# Patient Record
Sex: Female | Born: 1955 | Race: White | Hispanic: No | Marital: Married | Smoking: Never smoker
Health system: Southern US, Community
[De-identification: ages and names within clinical notes are randomized; demographics above are authoritative.]

## PROBLEM LIST (undated history)

## (undated) DIAGNOSIS — E785 Hyperlipidemia, unspecified: Secondary | ICD-10-CM

## (undated) DIAGNOSIS — I10 Essential (primary) hypertension: Secondary | ICD-10-CM

## (undated) DIAGNOSIS — H8109 Meniere's disease, unspecified ear: Secondary | ICD-10-CM

## (undated) DIAGNOSIS — R7301 Impaired fasting glucose: Secondary | ICD-10-CM

## (undated) DIAGNOSIS — R42 Dizziness and giddiness: Secondary | ICD-10-CM

## (undated) HISTORY — DX: Impaired fasting glucose: R73.01

## (undated) HISTORY — PX: BARTHOLIN GLAND CYST EXCISION: SHX565

## (undated) HISTORY — DX: Essential (primary) hypertension: I10

## (undated) HISTORY — PX: ABDOMINAL HYSTERECTOMY: SHX81

## (undated) HISTORY — DX: Hyperlipidemia, unspecified: E78.5

## (undated) HISTORY — DX: Dizziness and giddiness: R42

## (undated) HISTORY — PX: OTHER SURGICAL HISTORY: SHX169

## (undated) HISTORY — DX: Meniere's disease, unspecified ear: H81.09

## (undated) HISTORY — PX: DILATION AND CURETTAGE OF UTERUS: SHX78

---

## 2002-06-22 ENCOUNTER — Encounter: Payer: Self-pay | Admitting: Family Medicine

## 2002-06-22 ENCOUNTER — Ambulatory Visit (HOSPITAL_COMMUNITY): Admission: RE | Admit: 2002-06-22 | Discharge: 2002-06-22 | Payer: Self-pay | Admitting: Family Medicine

## 2004-02-04 ENCOUNTER — Ambulatory Visit (HOSPITAL_COMMUNITY): Admission: RE | Admit: 2004-02-04 | Discharge: 2004-02-04 | Payer: Self-pay | Admitting: Family Medicine

## 2006-07-06 ENCOUNTER — Other Ambulatory Visit: Admission: RE | Admit: 2006-07-06 | Discharge: 2006-07-06 | Payer: Self-pay | Admitting: Gynecology

## 2006-09-16 ENCOUNTER — Emergency Department (HOSPITAL_COMMUNITY): Admission: EM | Admit: 2006-09-16 | Discharge: 2006-09-16 | Payer: Self-pay | Admitting: Emergency Medicine

## 2006-12-12 ENCOUNTER — Ambulatory Visit (HOSPITAL_COMMUNITY): Admission: RE | Admit: 2006-12-12 | Discharge: 2006-12-13 | Payer: Self-pay | Admitting: Gynecology

## 2011-06-09 ENCOUNTER — Other Ambulatory Visit (HOSPITAL_COMMUNITY): Payer: Self-pay | Admitting: Internal Medicine

## 2011-06-09 DIAGNOSIS — Z139 Encounter for screening, unspecified: Secondary | ICD-10-CM

## 2011-06-14 ENCOUNTER — Ambulatory Visit (HOSPITAL_COMMUNITY)
Admission: RE | Admit: 2011-06-14 | Discharge: 2011-06-14 | Disposition: A | Discharge status: Home / Self Care | Payer: BC Managed Care – PPO | Source: Ambulatory Visit | Attending: Internal Medicine | Admitting: Internal Medicine

## 2011-06-14 DIAGNOSIS — Z1231 Encounter for screening mammogram for malignant neoplasm of breast: Secondary | ICD-10-CM | POA: Insufficient documentation

## 2011-06-14 DIAGNOSIS — Z139 Encounter for screening, unspecified: Secondary | ICD-10-CM

## 2011-06-30 ENCOUNTER — Encounter (INDEPENDENT_AMBULATORY_CARE_PROVIDER_SITE_OTHER): Payer: Self-pay | Admitting: *Deleted

## 2011-09-23 ENCOUNTER — Ambulatory Visit (INDEPENDENT_AMBULATORY_CARE_PROVIDER_SITE_OTHER): Payer: BC Managed Care – PPO | Admitting: Otolaryngology

## 2011-09-23 DIAGNOSIS — H9319 Tinnitus, unspecified ear: Secondary | ICD-10-CM

## 2011-09-23 DIAGNOSIS — H903 Sensorineural hearing loss, bilateral: Secondary | ICD-10-CM

## 2012-07-14 ENCOUNTER — Ambulatory Visit: Payer: BC Managed Care – PPO | Attending: Otolaryngology | Admitting: Audiology

## 2012-07-14 DIAGNOSIS — Z0110 Encounter for examination of ears and hearing without abnormal findings: Secondary | ICD-10-CM | POA: Insufficient documentation

## 2012-07-14 DIAGNOSIS — Z0389 Encounter for observation for other suspected diseases and conditions ruled out: Secondary | ICD-10-CM | POA: Insufficient documentation

## 2012-09-21 ENCOUNTER — Ambulatory Visit (INDEPENDENT_AMBULATORY_CARE_PROVIDER_SITE_OTHER): Payer: BC Managed Care – PPO | Admitting: Otolaryngology

## 2013-02-01 ENCOUNTER — Other Ambulatory Visit (HOSPITAL_COMMUNITY): Payer: Self-pay | Admitting: Internal Medicine

## 2013-02-01 DIAGNOSIS — M810 Age-related osteoporosis without current pathological fracture: Secondary | ICD-10-CM

## 2013-02-02 ENCOUNTER — Other Ambulatory Visit (HOSPITAL_COMMUNITY): Payer: BC Managed Care – PPO

## 2014-04-26 ENCOUNTER — Encounter (INDEPENDENT_AMBULATORY_CARE_PROVIDER_SITE_OTHER): Payer: Self-pay | Admitting: *Deleted

## 2016-04-06 ENCOUNTER — Other Ambulatory Visit (HOSPITAL_COMMUNITY): Payer: Self-pay | Admitting: Internal Medicine

## 2016-04-06 DIAGNOSIS — Z1231 Encounter for screening mammogram for malignant neoplasm of breast: Secondary | ICD-10-CM

## 2016-04-08 ENCOUNTER — Ambulatory Visit (INDEPENDENT_AMBULATORY_CARE_PROVIDER_SITE_OTHER): Payer: BC Managed Care – PPO | Admitting: Otolaryngology

## 2016-04-08 DIAGNOSIS — H8109 Meniere's disease, unspecified ear: Secondary | ICD-10-CM

## 2016-04-08 DIAGNOSIS — H903 Sensorineural hearing loss, bilateral: Secondary | ICD-10-CM | POA: Diagnosis not present

## 2016-04-15 ENCOUNTER — Ambulatory Visit (HOSPITAL_COMMUNITY): Payer: BC Managed Care – PPO

## 2016-04-26 ENCOUNTER — Ambulatory Visit (HOSPITAL_COMMUNITY): Payer: BC Managed Care – PPO

## 2016-05-03 ENCOUNTER — Ambulatory Visit (HOSPITAL_COMMUNITY)
Admission: RE | Admit: 2016-05-03 | Discharge: 2016-05-03 | Disposition: A | Discharge status: Home / Self Care | Payer: BC Managed Care – PPO | Source: Ambulatory Visit | Attending: Internal Medicine | Admitting: Internal Medicine

## 2016-05-03 DIAGNOSIS — Z1231 Encounter for screening mammogram for malignant neoplasm of breast: Secondary | ICD-10-CM | POA: Insufficient documentation

## 2016-06-07 ENCOUNTER — Ambulatory Visit (INDEPENDENT_AMBULATORY_CARE_PROVIDER_SITE_OTHER): Payer: BC Managed Care – PPO | Admitting: Otolaryngology

## 2016-06-07 DIAGNOSIS — H8102 Meniere's disease, left ear: Secondary | ICD-10-CM

## 2016-06-07 DIAGNOSIS — H9313 Tinnitus, bilateral: Secondary | ICD-10-CM

## 2016-06-07 DIAGNOSIS — H903 Sensorineural hearing loss, bilateral: Secondary | ICD-10-CM | POA: Diagnosis not present

## 2017-04-13 ENCOUNTER — Other Ambulatory Visit (HOSPITAL_COMMUNITY): Payer: Self-pay | Admitting: Internal Medicine

## 2017-04-13 DIAGNOSIS — H539 Unspecified visual disturbance: Secondary | ICD-10-CM

## 2017-04-13 DIAGNOSIS — R51 Headache: Secondary | ICD-10-CM

## 2017-04-13 DIAGNOSIS — R519 Headache, unspecified: Secondary | ICD-10-CM

## 2017-04-13 DIAGNOSIS — R42 Dizziness and giddiness: Secondary | ICD-10-CM

## 2017-04-22 ENCOUNTER — Ambulatory Visit (HOSPITAL_COMMUNITY)
Admission: RE | Admit: 2017-04-22 | Discharge: 2017-04-22 | Disposition: A | Discharge status: Home / Self Care | Payer: BC Managed Care – PPO | Source: Ambulatory Visit | Attending: Internal Medicine | Admitting: Internal Medicine

## 2017-04-22 DIAGNOSIS — H539 Unspecified visual disturbance: Secondary | ICD-10-CM | POA: Insufficient documentation

## 2017-04-22 DIAGNOSIS — R519 Headache, unspecified: Secondary | ICD-10-CM

## 2017-04-22 DIAGNOSIS — R42 Dizziness and giddiness: Secondary | ICD-10-CM | POA: Diagnosis not present

## 2017-04-22 DIAGNOSIS — R51 Headache: Secondary | ICD-10-CM | POA: Diagnosis present

## 2017-05-19 ENCOUNTER — Ambulatory Visit (INDEPENDENT_AMBULATORY_CARE_PROVIDER_SITE_OTHER): Payer: BC Managed Care – PPO | Admitting: Otolaryngology

## 2017-05-19 DIAGNOSIS — R42 Dizziness and giddiness: Secondary | ICD-10-CM

## 2017-05-19 DIAGNOSIS — H8102 Meniere's disease, left ear: Secondary | ICD-10-CM

## 2017-05-19 DIAGNOSIS — H903 Sensorineural hearing loss, bilateral: Secondary | ICD-10-CM | POA: Diagnosis not present

## 2017-06-20 ENCOUNTER — Ambulatory Visit (INDEPENDENT_AMBULATORY_CARE_PROVIDER_SITE_OTHER): Payer: BC Managed Care – PPO | Admitting: Otolaryngology

## 2017-06-20 DIAGNOSIS — H8102 Meniere's disease, left ear: Secondary | ICD-10-CM

## 2017-06-20 DIAGNOSIS — H903 Sensorineural hearing loss, bilateral: Secondary | ICD-10-CM

## 2017-12-26 ENCOUNTER — Ambulatory Visit (INDEPENDENT_AMBULATORY_CARE_PROVIDER_SITE_OTHER): Payer: BC Managed Care – PPO | Admitting: Otolaryngology

## 2017-12-26 DIAGNOSIS — R040 Epistaxis: Secondary | ICD-10-CM

## 2017-12-26 DIAGNOSIS — H8102 Meniere's disease, left ear: Secondary | ICD-10-CM | POA: Diagnosis not present

## 2017-12-26 DIAGNOSIS — H903 Sensorineural hearing loss, bilateral: Secondary | ICD-10-CM | POA: Diagnosis not present

## 2018-05-16 ENCOUNTER — Other Ambulatory Visit: Payer: Self-pay | Admitting: Internal Medicine

## 2018-05-16 DIAGNOSIS — Z780 Asymptomatic menopausal state: Secondary | ICD-10-CM

## 2018-05-31 ENCOUNTER — Other Ambulatory Visit: Payer: Self-pay | Admitting: Internal Medicine

## 2018-05-31 DIAGNOSIS — Z1231 Encounter for screening mammogram for malignant neoplasm of breast: Secondary | ICD-10-CM

## 2018-06-07 ENCOUNTER — Ambulatory Visit (HOSPITAL_COMMUNITY)
Admission: RE | Admit: 2018-06-07 | Discharge: 2018-06-07 | Disposition: A | Discharge status: Home / Self Care | Payer: BC Managed Care – PPO | Source: Ambulatory Visit | Attending: Internal Medicine | Admitting: Internal Medicine

## 2018-06-07 DIAGNOSIS — Z780 Asymptomatic menopausal state: Secondary | ICD-10-CM | POA: Diagnosis present

## 2018-06-07 DIAGNOSIS — Z1231 Encounter for screening mammogram for malignant neoplasm of breast: Secondary | ICD-10-CM | POA: Insufficient documentation

## 2018-06-26 ENCOUNTER — Ambulatory Visit (INDEPENDENT_AMBULATORY_CARE_PROVIDER_SITE_OTHER): Payer: BC Managed Care – PPO | Admitting: Otolaryngology

## 2018-06-26 ENCOUNTER — Ambulatory Visit: Payer: BC Managed Care – PPO

## 2018-06-26 DIAGNOSIS — H8102 Meniere's disease, left ear: Secondary | ICD-10-CM

## 2018-06-26 DIAGNOSIS — H903 Sensorineural hearing loss, bilateral: Secondary | ICD-10-CM | POA: Diagnosis not present

## 2018-07-26 ENCOUNTER — Encounter: Payer: Self-pay | Admitting: *Deleted

## 2018-07-27 ENCOUNTER — Encounter: Payer: Self-pay | Admitting: Neurology

## 2018-07-27 ENCOUNTER — Encounter

## 2018-07-27 ENCOUNTER — Ambulatory Visit: Payer: BC Managed Care – PPO | Admitting: Neurology

## 2018-07-27 VITALS — BP 137/94 | HR 95 | Ht 63.0 in | Wt 187.0 lb

## 2018-07-27 DIAGNOSIS — H8109 Meniere's disease, unspecified ear: Secondary | ICD-10-CM | POA: Diagnosis not present

## 2018-07-27 DIAGNOSIS — H9190 Unspecified hearing loss, unspecified ear: Secondary | ICD-10-CM | POA: Insufficient documentation

## 2018-07-27 NOTE — Progress Notes (Addendum)
PATIENT: Yolanda Novak DOB: February 13, 1956  Chief Complaint  Patient presents with  . Dizziness/Meniere Disease    Orthostatic Vitals: Lying: 137/94, 95, Sitting: 139/86, 95, Standing: 145/82, 91, Standing x 3 minutes: 144/83, 89.  Reports intermittent dizziness often worsened by sudden, positional changes.  She has also had dizzy spells that will just last throughout the day.  Reports ringing in her bilateral ears.  She is under the care of ENT. She has had a normal brain MRI.  Marland Kitchen PCP    [name], MD     HISTORICAL  Yolanda Novak is a 62 years old female, seen in request by primary care physician Dr. [name], [name] for evaluation of dizziness, initial evaluation was on July 27, 2018.  I have reviewed and summarized the referring note from the referring physician, she has past medical history of hypertension, is on hydrochlorothiazide treatment.  She noticed gradual onset hearing loss since 2009, she began to have intermittent episode of intense vertigo, spinning sensation, and bilateral ear fullness sensation, decreased hearing, she had few spells each year, with no associated headaches," and never had a history of headaches".  Over the past few years, her spinning spells has much improved, but she continues to have steady decline of her hearing loss, right worse than left, over the years, she was seen by ENT physician, was diagnosed with Mnire's disease, now wearing bilateral hearing aid.  But her hearing loss continued to progressively getting worse, she has to upgrade her hearing aid periodically.  She works as an Environmental health practitioner, noticed difficulty understanding people's conversation, has to stare at people's mouth, she denies gait abnormality   Personally reviewed MRI of the brain without contrast in June 2018 that was normal.  REVIEW OF SYSTEMS: Full 14 system review of systems performed and notable only for hearing loss, ringing in ears, spinning sensation,  dizziness All other review of systems were negative.  ALLERGIES: Allergies  Allergen Reactions  . Sulfamethoxazole Other (See Comments)    Unsure of her reaction as a child.    HOME MEDICATIONS: Current Outpatient Medications  Medication Sig Dispense Refill  . CALCIUM PO Take 1 tablet by mouth daily.    . Multiple Vitamin (MULTIVITAMIN) capsule Take 1 capsule by mouth daily.    . Olmesartan-amLODIPine-HCTZ 20-5-12.5 MG TABS Take 1 tablet by mouth daily.  1   No current facility-administered medications for this visit.     PAST MEDICAL HISTORY: Past Medical History:  Diagnosis Date  . Dizziness   . Hyperlipemia   . Hypertension   . Impaired fasting glucose   . Meniere disease     PAST SURGICAL HISTORY: Past Surgical History:  Procedure Laterality Date  . ABDOMINAL HYSTERECTOMY  2000's  . BARTHOLIN GLAND CYST EXCISION  1970'S  . BROKEN RIGHT ARM REPAIR  1960's  . DILATION AND CURETTAGE OF UTERUS  1980's    FAMILY HISTORY: Family History  Problem Relation Age of Onset  . Other Mother        failure to thrive  . Stomach cancer Father   . Throat cancer Brother   . Kidney cancer Brother   . Diabetes Brother   . Heart disease Paternal Grandmother     SOCIAL HISTORY: Social History   Socioeconomic History  . Marital status: Married    Spouse name: Not on file  . Number of children: 2  . Years of education: college  . Highest education level: Associate degree: occupational, Scientist, product/process development, or vocational  program  Occupational History  . Occupation: Environmental health practitionerAdministrative Assistant  Social Needs  . Financial resource strain: Not on file  . Food insecurity:    Worry: Not on file    Inability: Not on file  . Transportation needs:    Medical: Not on file    Non-medical: Not on file  Tobacco Use  . Smoking status: Never Smoker  . Smokeless tobacco: Never Used  Substance and Sexual Activity  . Alcohol use: Yes    Frequency: Never    Comment: rare mixed drink  . Drug  use: Never  . Sexual activity: Not on file  Lifestyle  . Physical activity:    Days per week: Not on file    Minutes per session: Not on file  . Stress: Not on file  Relationships  . Social connections:    Talks on phone: Not on file    Gets together: Not on file    Attends religious service: Not on file    Active member of club or organization: Not on file    Attends meetings of clubs or organizations: Not on file    Relationship status: Not on file  . Intimate partner violence:    Fear of current or ex partner: Not on file    Emotionally abused: Not on file    Physically abused: Not on file    Forced sexual activity: Not on file  Other Topics Concern  . Not on file  Social History Narrative   Caffeine use: 1 cup coffee, 2 glasses of tea.   Right-handed.   Lives at home with her husband, Craige CottaKirby.     PHYSICAL EXAM   Vitals:   07/27/18 0745  BP: (!) 137/94  Pulse: 95  Weight: 187 lb (84.8 kg)  Height: 5\' 3"  (1.6 m)    Not recorded      Body mass index is 33.13 kg/m.  PHYSICAL EXAMNIATION:  Gen: NAD, conversant, well nourised, obese, well groomed                     Cardiovascular: Regular rate rhythm, no peripheral edema, warm, nontender. Eyes: Conjunctivae clear without exudates or hemorrhage Neck: Supple, no carotid bruits. Pulmonary: Clear to auscultation bilaterally   NEUROLOGICAL EXAM:  MENTAL STATUS: Speech:    Speech is normal; fluent and spontaneous with normal comprehension.  Cognition:     Orientation to time, place and person     Normal recent and remote memory     Normal Attention span and concentration     Normal Language, naming, repeating,spontaneous speech     Fund of knowledge   CRANIAL NERVES: CN II: Visual fields are full to confrontation. Fundoscopic exam is normal with sharp discs and no vascular changes. Pupils are round equal and briskly reactive to light. CN III, IV, VI: extraocular movement are normal. No ptosis. CN V: Facial  sensation is intact to pinprick in all 3 divisions bilaterally. Corneal responses are intact.  CN VII: Face is symmetric with normal eye closure and smile. CN VIII: Decreased hearing bilaterally CN IX, X: Palate elevates symmetrically. Phonation is normal. CN XI: Head turning and shoulder shrug are intact CN XII: Tongue is midline with normal movements and no atrophy.  MOTOR: There is no pronator drift of out-stretched arms. Muscle bulk and tone are normal. Muscle strength is normal.  REFLEXES: Reflexes are 2+ and symmetric at the biceps, triceps, knees, and ankles. Plantar responses are flexor.  SENSORY: Intact to light touch,  pinprick, positional sensation and vibratory sensation are intact in fingers and toes.  COORDINATION: Rapid alternating movements and fine finger movements are intact. There is no dysmetria on finger-to-nose and heel-knee-shin.    GAIT/STANCE: Posture is normal. Gait is steady with normal steps, base, arm swing, and turning. Heel and toe walking are normal. Tandem gait is normal.  Romberg is absent.   DIAGNOSTIC DATA (LABS, IMAGING, TESTING) - I reviewed patient records, labs, notes, testing and imaging myself where available.   ASSESSMENT AND PLAN  IRVA LOSER is a 62 y.o. female   Mnire's disease,  Progressive hearing loss  Normal MRI of the brain in May 2018  Wearing hearing aid, continue follow-up with his ENT physician   Levert Feinstein, M.D. Ph.D.  [facility] [address] [place], [place] [address] Ph: [phone] Fax: [phone]  CC:  [name], MD

## 2019-02-21 ENCOUNTER — Encounter: Payer: Self-pay | Admitting: Gastroenterology

## 2019-04-05 ENCOUNTER — Encounter: Payer: Self-pay | Admitting: Gastroenterology

## 2019-04-26 ENCOUNTER — Ambulatory Visit: Payer: Self-pay

## 2019-05-28 ENCOUNTER — Other Ambulatory Visit: Payer: Self-pay | Admitting: Internal Medicine

## 2019-05-28 DIAGNOSIS — Z1231 Encounter for screening mammogram for malignant neoplasm of breast: Secondary | ICD-10-CM

## 2019-05-28 DIAGNOSIS — E2839 Other primary ovarian failure: Secondary | ICD-10-CM

## 2019-05-28 DIAGNOSIS — Z780 Asymptomatic menopausal state: Secondary | ICD-10-CM

## 2019-07-02 ENCOUNTER — Ambulatory Visit (INDEPENDENT_AMBULATORY_CARE_PROVIDER_SITE_OTHER): Payer: BC Managed Care – PPO | Admitting: Otolaryngology

## 2019-07-02 DIAGNOSIS — H8103 Meniere's disease, bilateral: Secondary | ICD-10-CM

## 2019-07-02 DIAGNOSIS — H903 Sensorineural hearing loss, bilateral: Secondary | ICD-10-CM

## 2019-07-05 ENCOUNTER — Ambulatory Visit: Payer: BC Managed Care – PPO

## 2020-06-06 ENCOUNTER — Other Ambulatory Visit (HOSPITAL_COMMUNITY): Payer: Self-pay | Admitting: Internal Medicine

## 2020-06-06 DIAGNOSIS — Z1231 Encounter for screening mammogram for malignant neoplasm of breast: Secondary | ICD-10-CM

## 2020-06-09 ENCOUNTER — Inpatient Hospital Stay (HOSPITAL_COMMUNITY): Admission: RE | Admit: 2020-06-09 | Payer: BC Managed Care – PPO | Source: Ambulatory Visit

## 2020-06-16 ENCOUNTER — Ambulatory Visit (HOSPITAL_COMMUNITY)
Admission: RE | Admit: 2020-06-16 | Discharge: 2020-06-16 | Disposition: A | Discharge status: Home / Self Care | Payer: BC Managed Care – PPO | Source: Ambulatory Visit | Attending: Internal Medicine | Admitting: Internal Medicine

## 2020-06-16 ENCOUNTER — Other Ambulatory Visit: Payer: Self-pay

## 2020-06-16 DIAGNOSIS — Z1231 Encounter for screening mammogram for malignant neoplasm of breast: Secondary | ICD-10-CM | POA: Insufficient documentation

## 2021-12-15 ENCOUNTER — Encounter (HOSPITAL_COMMUNITY): Payer: Self-pay | Admitting: Emergency Medicine

## 2021-12-15 ENCOUNTER — Emergency Department (HOSPITAL_COMMUNITY): Payer: Medicare PPO

## 2021-12-15 ENCOUNTER — Other Ambulatory Visit: Payer: Self-pay

## 2021-12-15 ENCOUNTER — Emergency Department (HOSPITAL_COMMUNITY)
Admission: EM | Admit: 2021-12-15 | Discharge: 2021-12-15 | Disposition: A | Discharge status: Home / Self Care | Payer: Medicare PPO | Attending: Emergency Medicine | Admitting: Emergency Medicine

## 2021-12-15 DIAGNOSIS — Y9301 Activity, walking, marching and hiking: Secondary | ICD-10-CM | POA: Insufficient documentation

## 2021-12-15 DIAGNOSIS — M722 Plantar fascial fibromatosis: Secondary | ICD-10-CM | POA: Diagnosis not present

## 2021-12-15 DIAGNOSIS — Y92828 Other wilderness area as the place of occurrence of the external cause: Secondary | ICD-10-CM | POA: Diagnosis not present

## 2021-12-15 DIAGNOSIS — M79672 Pain in left foot: Secondary | ICD-10-CM | POA: Diagnosis present

## 2021-12-15 NOTE — ED Provider Triage Note (Signed)
Emergency Medicine Provider Triage Evaluation Note  Yolanda Novak , a 66 y.o. female  was evaluated in triage.  Pt complains of left plantar foot pain.  Patient reports that yesterday she started to feel discomfort around the lateral part of her left foot.  No known injury.  Sharp and uncomfortable pain when she walks.  Review of Systems  Positive: Foot pain Negative: Numbness or tingling  Physical Exam  BP (!) 136/59 (BP Location: Right Arm)    Pulse (!) 101    Temp 98.3 F (36.8 C)    Resp 16    Ht 5\' 3"  (1.6 m)    Wt 74.8 kg    SpO2 97%    BMI 29.23 kg/m  Gen:   Awake, no distress   Resp:  Normal effort  MSK:   Moves extremities without difficulty  Other:  No bruising or obvious deformity.  Strong DP pulses.  Full range of motion of ankle.  Tenderness to arch  Medical Decision Making  Medically screening exam initiated at 5:48 PM.  Appropriate orders placed.  Yolanda Novak was informed that the remainder of the evaluation will be completed by another provider, this initial triage assessment does not replace that evaluation, and the importance of remaining in the ED until their evaluation is complete.  Negative x-ray, benign PE   Ameerah Huffstetler A, PA-C 12/15/21 1749

## 2021-12-15 NOTE — Discharge Instructions (Addendum)
Please use the boot when you are ambulating.  Called the podiatrist tomorrow morning.  Use the orthopedic referral if you are unable to get in with podiatry.  Ibuprofen or naproxen are good options for pain.  Do not take these together.  You may take 1 of these with Tylenol.  Information about plantar fasciitis is attached to these discharge papers.

## 2021-12-15 NOTE — ED Triage Notes (Signed)
Pt having left foot pain, pain worse when standing or putting weight on foot, started yesterday, per pt, no known injury.

## 2021-12-15 NOTE — ED Provider Notes (Signed)
[facility] EMERGENCY DEPARTMENT Provider Note   CSN: [id number] Arrival date & time: 12/15/21  1538     History  Chief Complaint  Patient presents with   Foot Pain    TENE AMICUCCI is a 66 y.o. female with a past medical history of osteoarthritis presenting today due to left foot pain.  Patient reports that this began suddenly today while she was trying to walk up a hill.  Denies any injury or falls.  No numbness or tingling.  Localizes the pain to the plantar surface of the foot.  Says that the pain is sharp.  She took ibuprofen that somewhat helped.  She called the podiatrist however she has not been able to get in with him today.   Foot Pain      Home Medications Prior to Admission medications   Medication Sig Start Date End Date Taking? Authorizing Provider  CALCIUM PO Take 1 tablet by mouth daily.    [provider]  Multiple Vitamin (MULTIVITAMIN) capsule Take 1 capsule by mouth daily.    [provider]  Olmesartan-amLODIPine-HCTZ 20-5-12.5 MG TABS Take 1 tablet by mouth daily. 05/16/18   [provider]      Allergies    Sulfamethoxazole    Review of Systems   Review of Systems  Physical Exam Updated Vital Signs BP (!) 136/59 (BP Location: Right Arm)    Pulse (!) 101    Temp 98.3 F (36.8 C)    Resp 16    Ht 5\' 3"  (1.6 m)    Wt 74.8 kg    SpO2 97%    BMI 29.23 kg/m  Physical Exam Vitals and nursing note reviewed.  Constitutional:      Appearance: Normal appearance.  HENT:     Head: Normocephalic and atraumatic.  Eyes:     General: No scleral icterus.    Conjunctiva/sclera: Conjunctivae normal.  Pulmonary:     Effort: Pulmonary effort is normal. No respiratory distress.  Musculoskeletal:     Comments: Patient with full range of motion of the ankle and MTPs.  Valgus deviation of all of her toes.  Large callus on the medial side of the big toe.  Skin:    General: Skin is warm and dry.     Findings: No rash.  Neurological:      Mental Status: She is alert.  Psychiatric:        Mood and Affect: Mood normal.    ED Results / Procedures / Treatments   Labs (all labs ordered are listed, but only abnormal results are displayed) Labs Reviewed - No data to display  EKG None  Radiology DG Foot Complete Left  Result Date: 12/15/2021 CLINICAL DATA:  Lateral and plantar foot pain with weight-bearing EXAM: LEFT FOOT - COMPLETE 3+ VIEW COMPARISON:  None. FINDINGS: Frontal, oblique, and lateral views of the left foot are obtained. There are no acute displaced fractures. Marked hallux valgus deformity is seen, with mild osteoarthritis of the first metatarsophalangeal joint. There is severe osteoarthritis of the second metatarsophalangeal joint. Moderate joint space narrowing is seen throughout the midfoot, greatest at the tarsometatarsal joints. Small inferior calcaneal spur. Mild diffuse soft tissue swelling. IMPRESSION: 1. Multifocal osteoarthritis. 2. Hallux valgus deformity. 3. Mild diffuse soft tissue swelling. Electronically Signed   By: Randa Ngo M.D.   On: 12/15/2021 17:11    Procedures Procedures    Medications Ordered in ED Medications - No data to display  ED Course/ Medical Decision Making/ A&P  Medical Decision Making Amount and/or Complexity of Data Reviewed Radiology: ordered.   66 year old female presenting with atraumatic left foot pain.  Worse with ambulation.  Started today.  Physical exam: Worse with dorsiflexion of the ankle and toes.  Has an obvious callus over her MTP joint of the left foot.  This appears to be causing her toes to bend in a valgus position.  She reports her discomfort from this callus has increased over the past 1 to 2 weeks and that she may be walking funny to compensate.  Imaging: X-ray ordered and interpreted by me.  I agree with the radiologist that there are no fractures however clear osteoarthritis in her great hallux.  Disposition: I believe  the patient to have Plantar fasciitis of her left foot.  She will be discharged in a cam boot and encouraged to use over-the-counter pain remedies until she is able to get in with podiatry.  She has been given a referral to podiatry and if she is having a difficult time getting in, she will also have a referral to orthopedics.  She and her husband are agreeable to this plan.  Final Clinical Impression(s) / ED Diagnoses Final diagnoses:  Plantar fasciitis of left foot    Rx / DC Orders Results and diagnoses were explained to the patient. Return precautions discussed in full. Patient had no additional questions and expressed complete understanding.   This chart was dictated using voice recognition software.  Despite best efforts to proofread,  errors can occur which can change the documentation meaning.    Darliss Ridgel 12/15/21 1850    Davonna Belling, MD 12/16/21 1023

## 2021-12-17 ENCOUNTER — Other Ambulatory Visit: Payer: Self-pay

## 2021-12-17 ENCOUNTER — Ambulatory Visit: Payer: Medicare PPO | Admitting: Podiatry

## 2021-12-17 DIAGNOSIS — M7672 Peroneal tendinitis, left leg: Secondary | ICD-10-CM | POA: Diagnosis not present

## 2021-12-17 DIAGNOSIS — M109 Gout, unspecified: Secondary | ICD-10-CM | POA: Diagnosis not present

## 2021-12-17 MED ORDER — TRIAMCINOLONE ACETONIDE 10 MG/ML IJ SUSP
10.0000 mg | Freq: Once | INTRAMUSCULAR | Status: AC
  Administered 2021-12-17: 10 mg

## 2021-12-17 MED ORDER — METHYLPREDNISOLONE 4 MG PO TBPK: ORAL_TABLET | ORAL | 0 refills | Status: DC

## 2021-12-20 MED ORDER — TRIAMCINOLONE ACETONIDE 10 MG/ML IJ SUSP: 10.0000 mg | Freq: Once | INTRAMUSCULAR | Status: AC

## 2021-12-20 NOTE — Progress Notes (Signed)
Subjective:   Patient ID: Yolanda Novak, female   DOB: 66 y.o.   MRN: [id number]   HPI Patient presents stating she is developed a lot of pain in the outside of her left foot and also had swelling.  She does have family history of gout and states that it occurred all of a sudden 4 days ago it is hard for her to be active with.  Patient does not smoke likes to be active   Review of Systems  All other systems reviewed and are negative.      Objective:  Physical Exam Vitals and nursing note reviewed.  Constitutional:      Appearance: She is well-developed.  Pulmonary:     Effort: Pulmonary effort is normal.  Musculoskeletal:        General: Normal range of motion.  Skin:    General: Skin is warm.  Neurological:     Mental Status: She is alert.    Neurovascular status intact muscle strength found to be adequate range of motion adequate with inflammation of the lateral side of the left foot around the peroneal tendon with inflammation fluid buildup and is noted to have discomfort and extreme pain around the area.  Range of motion was difficult due to the splinting just around this area but everything else appears to be intact and good digital perfusion noted     Assessment:  Peroneal tendinitis left that is probably due to inflammation but I cannot rule out systemic causes there is quite a bit of fluid buildup the pain     Plan:  H&P reviewed condition and went ahead today and did do sterile prep and did careful sheath injection around the inflamed area of the peroneal 3 mg Dexasone Kenalog 5 mg Xylocaine after discussing chances for rupture and also sent for blood work to rule out any systemic causes for the intense discomfort  X-rays were negative for signs that there is bony pathology within this area or signs of fracture

## 2021-12-24 LAB — CBC WITH DIFFERENTIAL/PLATELET
Basophils Absolute: 0.1 10*3/uL (ref 0.0–0.2)
Basos: 1 %
EOS (ABSOLUTE): 0.1 10*3/uL (ref 0.0–0.4)
Eos: 1 %
Hematocrit: 40.4 % (ref 34.0–46.6)
Hemoglobin: 13.8 g/dL (ref 11.1–15.9)
Immature Grans (Abs): 0 10*3/uL (ref 0.0–0.1)
Immature Granulocytes: 0 %
Lymphocytes Absolute: 1.6 10*3/uL (ref 0.7–3.1)
Lymphs: 19 %
MCH: 30.4 pg (ref 26.6–33.0)
MCHC: 34.2 g/dL (ref 31.5–35.7)
MCV: 89 fL (ref 79–97)
Monocytes Absolute: 0.6 10*3/uL (ref 0.1–0.9)
Monocytes: 7 %
Neutrophils Absolute: 6.3 10*3/uL (ref 1.4–7.0)
Neutrophils: 72 %
Platelets: 275 10*3/uL (ref 150–450)
RBC: 4.54 x10E6/uL (ref 3.77–5.28)
RDW: 11.8 % (ref 11.7–15.4)
WBC: 8.7 10*3/uL (ref 3.4–10.8)

## 2021-12-24 LAB — ANA: ANA Titer 1: POSITIVE — AB

## 2021-12-24 LAB — URIC ACID: Uric Acid: 4.3 mg/dL (ref 3.0–7.2)

## 2021-12-24 LAB — RHEUMATOID ARTHRITIS PROFILE
Cyclic Citrullin Peptide Ab: 4 units (ref 0–19)
Rhuematoid fact SerPl-aCnc: <10 IU/mL (ref <14.0)

## 2021-12-24 LAB — ENA+DNA/DS+ANTICH+CENTRO+FA...
Anti JO-1: <0.2 AI (ref 0.0–0.9)
Centromere Ab Screen: <0.2 AI (ref 0.0–0.9)
Chromatin Ab SerPl-aCnc: <0.2 AI (ref 0.0–0.9)
ENA RNP Ab: <0.2 AI (ref 0.0–0.9)
ENA SM Ab Ser-aCnc: <0.2 AI (ref 0.0–0.9)
ENA SSA (RO) Ab: 0.2 AI (ref 0.0–0.9)
ENA SSB (LA) Ab: <0.2 AI (ref 0.0–0.9)
Homogeneous Pattern: 1:160 {titer} — ABNORMAL HIGH
Scleroderma (Scl-70) (ENA) Antibody, IgG: <0.2 AI (ref 0.0–0.9)
dsDNA Ab: 3 IU/mL (ref 0–9)

## 2021-12-24 LAB — SEDIMENTATION RATE: Sed Rate: 24 mm/hr (ref 0–40)

## 2021-12-24 LAB — C-REACTIVE PROTEIN: CRP: 1 mg/L (ref 0–10)

## 2021-12-28 ENCOUNTER — Ambulatory Visit: Payer: Medicare PPO | Admitting: Podiatry

## 2021-12-28 ENCOUNTER — Other Ambulatory Visit: Payer: Self-pay

## 2021-12-28 ENCOUNTER — Encounter: Payer: Self-pay | Admitting: Podiatry

## 2021-12-28 DIAGNOSIS — M76822 Posterior tibial tendinitis, left leg: Secondary | ICD-10-CM

## 2021-12-28 DIAGNOSIS — L600 Ingrowing nail: Secondary | ICD-10-CM | POA: Diagnosis not present

## 2021-12-28 MED ORDER — TRIAMCINOLONE ACETONIDE 10 MG/ML IJ SUSP
10.0000 mg | Freq: Once | INTRAMUSCULAR | Status: AC
  Administered 2021-12-28: 10 mg

## 2021-12-28 NOTE — Patient Instructions (Signed)

## 2021-12-29 NOTE — Progress Notes (Signed)
Subjective:   Patient ID: Yolanda Novak, female   DOB: 66 y.o.   MRN: [id number]   HPI Patient presents with a lot of discomfort on the inside of the left foot now stating the outside is doing better but states the inside is hurting her and also has a severely thickened big toenail left which is bothering her and making it hard to wear shoe gear with long-term history of damage   ROS      Objective:  Physical Exam  Neurovascular status intact with inflammation pain of the posterior tibial insertion in the left with no indication of muscle strength loss or tendon damage with moderate depression of the arch and severely thickened dystrophic hallux nail left foot     Assessment:  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  She will wear this for 3 weeks and we discussed removal of the nail and explained would have to be done permanently and she wants surgery and today I infiltrated the left hallux 60 mg like Marcaine mixture sterile prep and using sterile instrumentation remove the hallux nail exposed matrix applied phenol 5 applications 30 seconds followed by alcohol lavage sterile dressing gave instructions on soaks and before doing the procedure had her sign consent form.  I then want her to leave the dressing on 24 hours but take it off earlier if throbbing were to occur and encouraged her to call with questions concerns

## 2022-01-25 ENCOUNTER — Encounter: Payer: Self-pay | Admitting: Podiatry

## 2022-01-25 ENCOUNTER — Other Ambulatory Visit: Payer: Self-pay

## 2022-01-25 ENCOUNTER — Ambulatory Visit: Payer: Medicare PPO | Admitting: Podiatry

## 2022-01-25 DIAGNOSIS — M21619 Bunion of unspecified foot: Secondary | ICD-10-CM | POA: Diagnosis not present

## 2022-01-25 DIAGNOSIS — M76822 Posterior tibial tendinitis, left leg: Secondary | ICD-10-CM

## 2022-01-25 DIAGNOSIS — L600 Ingrowing nail: Secondary | ICD-10-CM

## 2022-01-25 NOTE — Progress Notes (Signed)
Subjective:  ? ?Patient ID: Yolanda Novak, female   DOB: 66 y.o.   MRN: [id number]  ? ?HPI ?Patient presents stating that he is having improvement and while she continues to have mild discomfort she is much better and states nail is improving ? ? ?ROS ? ? ?   ?Objective:  ?Physical Exam  ?Neurovascular status intact with patient's left inner ankle showing diminishment of swelling with significant flatfoot deformity severe structural deformity with excellent healing of the hallux nail bed ? ?   ?Assessment:  ?Overall is doing well but still having mild discomfort posterior tibial tendon with significant foot structure issues with significant bunion deformity left and well-healing nail left ? ?   ?Plan:  ?Neurovascular status found to be intact muscle strength was found to be adequate range of motion adequate.  Patient does have flatfoot deformity is inflammation of the posterior tibial tendon left which is improving well-healed nail site severe carpal deformity that I reviewed with her.  At this point we will utilize support ice therapy occasional boot usage and anti-inflammatories and patient is discharged and will be seen back to recheck and is encouraged to call with questions concerns which may arise ?   ? ? ?

## 2022-04-09 IMAGING — DX DG FOOT COMPLETE 3+V*L*
3 series · 3 of 3 positions shown · non-contrast
Comparison: None.

CLINICAL DATA: Lateral and plantar foot pain with weight-bearing

EXAM:
LEFT FOOT - COMPLETE 3+ VIEW

[foot ap]
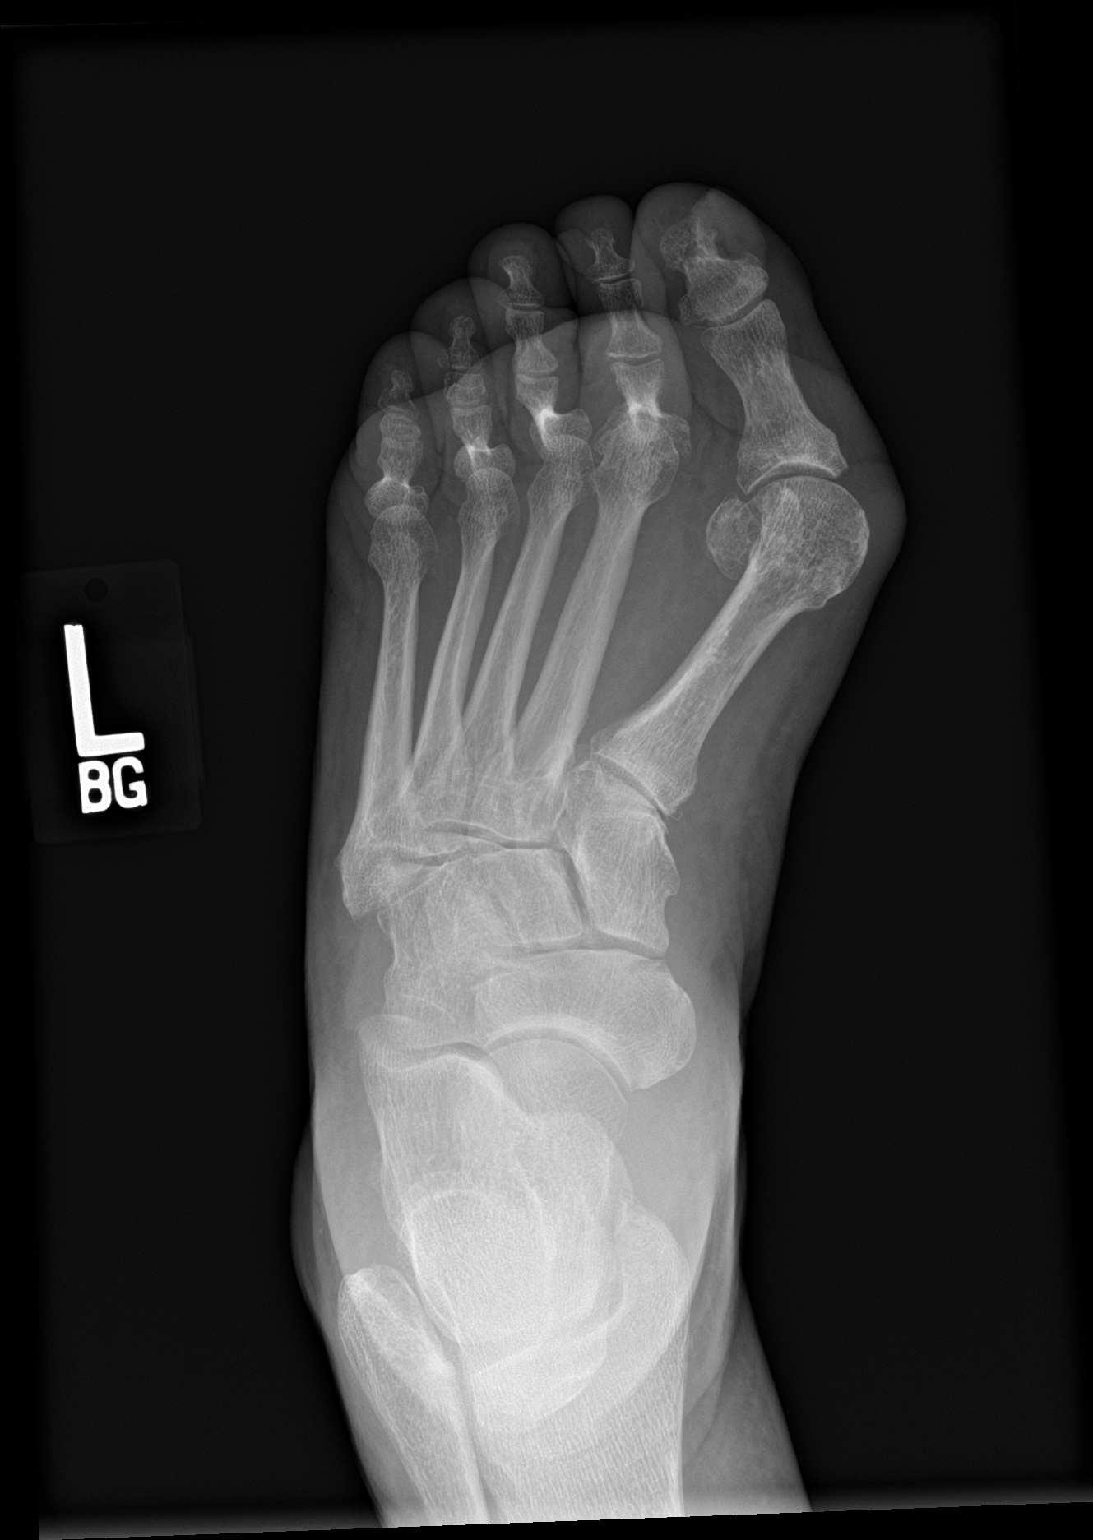

[foot obl]
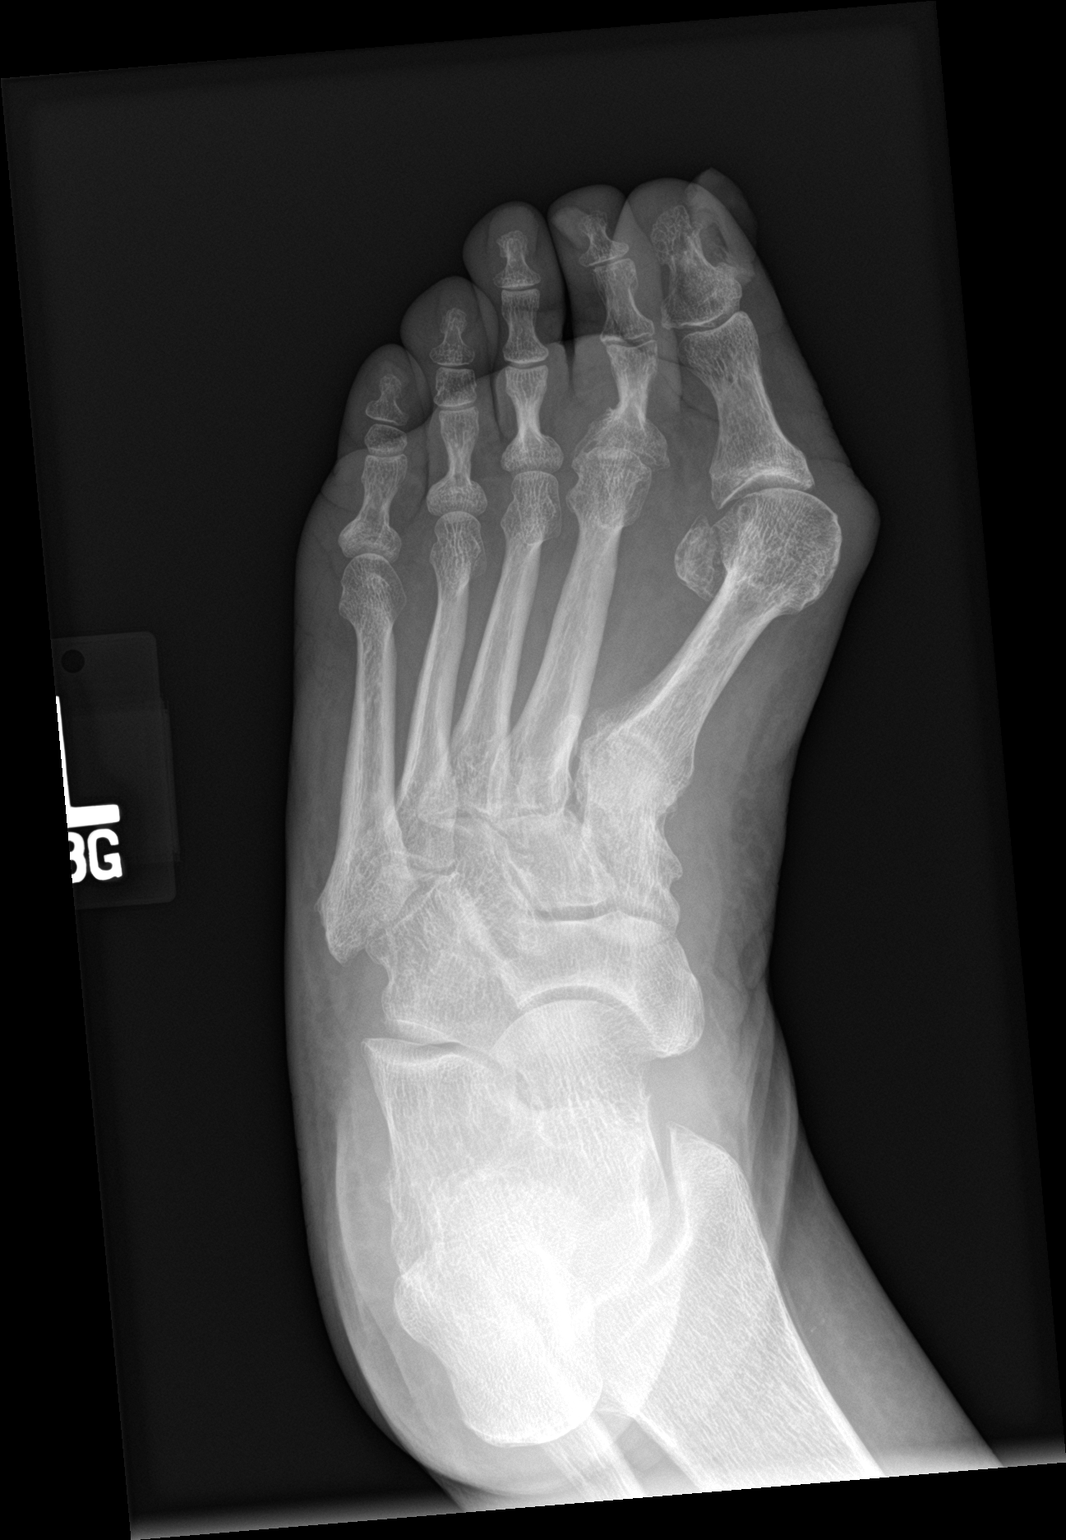

[foot lat]
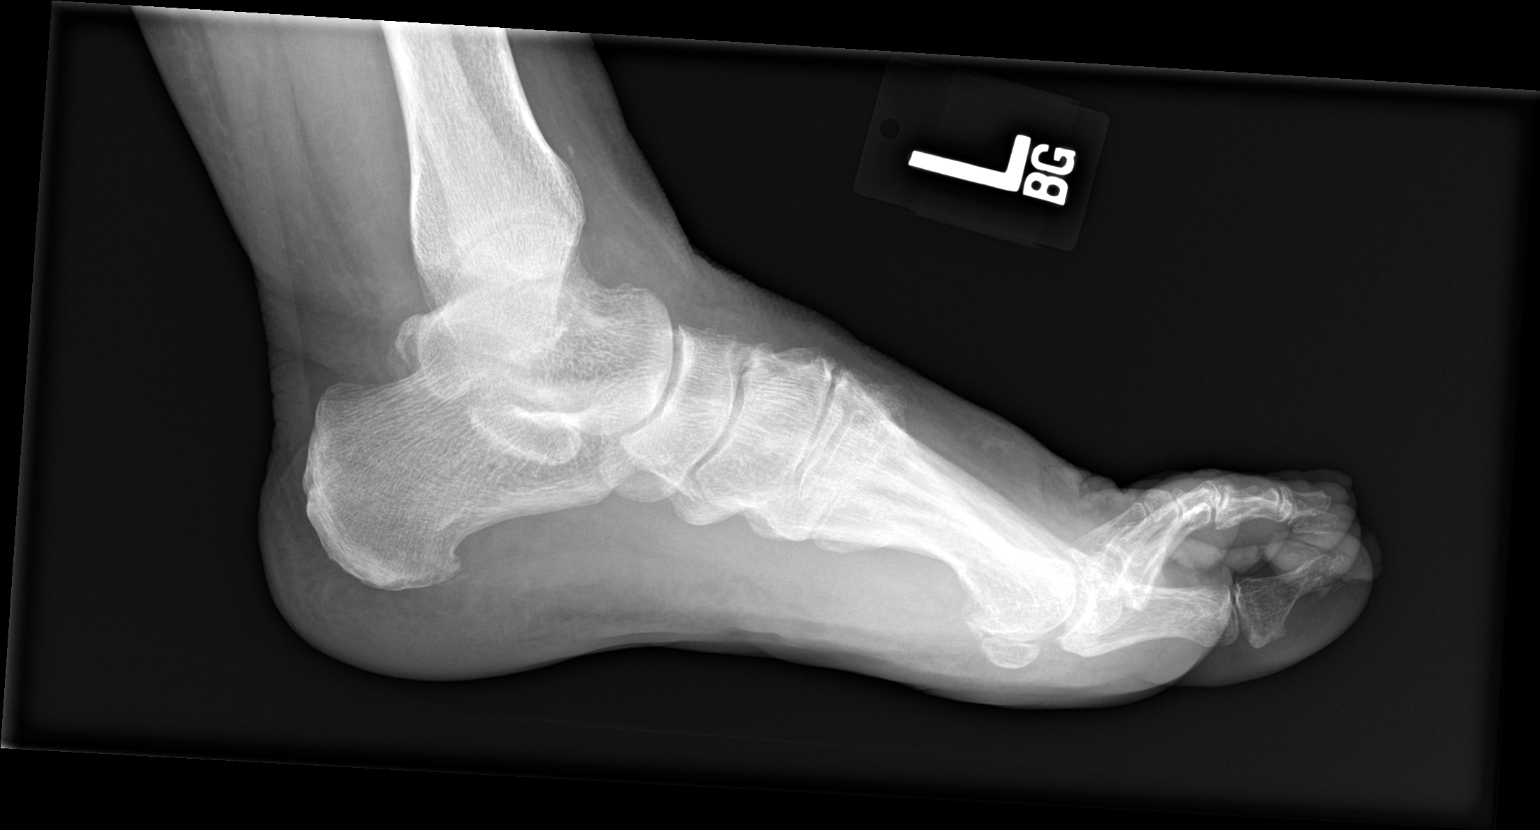

[3 of 3 positions shown; findings below may reference images not displayed]

FINDINGS: Frontal, oblique, and lateral views of the left foot are obtained.
There are no acute displaced fractures. Marked hallux valgus
deformity is seen, with mild osteoarthritis of the first
metatarsophalangeal joint. There is severe osteoarthritis of the
second metatarsophalangeal joint. Moderate joint space narrowing is
seen throughout the midfoot, greatest at the tarsometatarsal joints.
Small inferior calcaneal spur. Mild diffuse soft tissue swelling.
IMPRESSION: 1. Multifocal osteoarthritis.
2. Hallux valgus deformity.
3. Mild diffuse soft tissue swelling.

## 2022-06-01 ENCOUNTER — Other Ambulatory Visit (HOSPITAL_COMMUNITY): Payer: Self-pay | Admitting: Internal Medicine

## 2022-06-01 DIAGNOSIS — Z1231 Encounter for screening mammogram for malignant neoplasm of breast: Secondary | ICD-10-CM

## 2022-06-14 ENCOUNTER — Ambulatory Visit (HOSPITAL_COMMUNITY): Payer: Medicare PPO

## 2022-06-21 ENCOUNTER — Ambulatory Visit (HOSPITAL_COMMUNITY)
Admission: RE | Admit: 2022-06-21 | Discharge: 2022-06-21 | Disposition: A | Discharge status: Home / Self Care | Payer: Medicare PPO | Source: Ambulatory Visit | Attending: Internal Medicine | Admitting: Internal Medicine

## 2022-06-21 DIAGNOSIS — Z1231 Encounter for screening mammogram for malignant neoplasm of breast: Secondary | ICD-10-CM | POA: Diagnosis present

## 2022-07-28 ENCOUNTER — Other Ambulatory Visit: Payer: Self-pay | Admitting: Internal Medicine

## 2022-07-28 ENCOUNTER — Other Ambulatory Visit (HOSPITAL_COMMUNITY): Payer: Self-pay | Admitting: Internal Medicine

## 2022-07-28 DIAGNOSIS — E782 Mixed hyperlipidemia: Secondary | ICD-10-CM

## 2022-08-17 ENCOUNTER — Ambulatory Visit (HOSPITAL_COMMUNITY)
Admission: RE | Admit: 2022-08-17 | Discharge: 2022-08-17 | Disposition: A | Discharge status: Home / Self Care | Payer: Medicare PPO | Source: Ambulatory Visit | Attending: Internal Medicine | Admitting: Internal Medicine

## 2022-08-17 DIAGNOSIS — E782 Mixed hyperlipidemia: Secondary | ICD-10-CM | POA: Insufficient documentation

## 2022-09-03 ENCOUNTER — Encounter: Payer: Self-pay | Admitting: Internal Medicine

## 2022-09-30 ENCOUNTER — Ambulatory Visit: Payer: Medicare PPO | Admitting: Internal Medicine

## 2022-10-20 ENCOUNTER — Encounter: Payer: Self-pay | Admitting: *Deleted

## 2022-10-20 ENCOUNTER — Ambulatory Visit (INDEPENDENT_AMBULATORY_CARE_PROVIDER_SITE_OTHER): Payer: Medicare PPO | Admitting: Internal Medicine

## 2022-10-20 ENCOUNTER — Encounter: Payer: Self-pay | Admitting: Internal Medicine

## 2022-10-20 VITALS — BP 128/84 | HR 91 | Temp 98.5°F | Ht 63.0 in | Wt 164.6 lb

## 2022-10-20 DIAGNOSIS — K219 Gastro-esophageal reflux disease without esophagitis: Secondary | ICD-10-CM

## 2022-10-20 DIAGNOSIS — R933 Abnormal findings on diagnostic imaging of other parts of digestive tract: Secondary | ICD-10-CM

## 2022-10-20 DIAGNOSIS — K449 Diaphragmatic hernia without obstruction or gangrene: Secondary | ICD-10-CM | POA: Diagnosis not present

## 2022-10-20 DIAGNOSIS — Z1211 Encounter for screening for malignant neoplasm of colon: Secondary | ICD-10-CM | POA: Diagnosis not present

## 2022-10-20 NOTE — Progress Notes (Signed)
Primary Care Physician:  [name], MD Primary Gastroenterologist:  Dr. Marletta Lor  Chief Complaint  Patient presents with   Hernia    Arrives with husband Craige Cotta. New patient. Referred by Dr. [name] for abnormal thickening in esophagus and hiatial hernia.     HPI:   Yolanda Novak is a 66 y.o. female who presents to the clinic today by referral from her PCP Dr. [name] for evaluation.  Patient recently underwent CT for cardiac scoring which showed small hiatal hernia and mild symmetric distal esophageal wall thickening.  Patient notes very rare acid reflux/heartburn.  Symptoms are very mild.  No dysphagia odynophagia.  No epigastric or chest pain.  No previous upper endoscopy.  No previous colonoscopy.  States she has Cologuard testing performed regularly though she is currently due.  Requesting this today.  No family history of colorectal malignancy.  No melena hematochezia.  No unintentional weight loss.  Patient's husband Craige Cotta is scheduled to see me next month to discuss liver issues.  He accompanies her today.  Past Medical History:  Diagnosis Date   Dizziness    Hyperlipemia    Hypertension    Impaired fasting glucose    Meniere disease     Past Surgical History:  Procedure Laterality Date   ABDOMINAL HYSTERECTOMY  2000's   BARTHOLIN GLAND CYST EXCISION  1970'S   BROKEN RIGHT ARM REPAIR  1960's   DILATION AND CURETTAGE OF UTERUS  1980's    Current Outpatient Medications  Medication Sig Dispense Refill   CALCIUM PO Take 1 tablet by mouth daily.     Multiple Vitamin (MULTIVITAMIN) capsule Take 1 capsule by mouth daily.     Olmesartan-amLODIPine-HCTZ 20-5-12.5 MG TABS Take 1 tablet by mouth daily.  1   rosuvastatin (CRESTOR) 5 MG tablet Take 5 mg by mouth. One every other day     Current Facility-Administered Medications  Medication Dose Route Frequency Provider Last Rate Last Admin   triamcinolone acetonide (KENALOG) 10 MG/ML injection 10 mg  10 mg Other Once Lenn Sink, DPM        Allergies as of 10/20/2022 - Review Complete 10/20/2022  Allergen Reaction Noted   Sulfamethoxazole Other (See Comments) 07/27/2016    Family History  Problem Relation Age of Onset   Other Mother        failure to thrive   Stomach cancer Father    Throat cancer Brother    Kidney cancer Brother    Diabetes Brother    Heart disease Paternal Grandmother     Social History   Socioeconomic History   Marital status: Married    Spouse name: Not on file   Number of children: 2   Years of education: college   Highest education level: Associate degree: occupational, Scientist, product/process development, or vocational program  Occupational History   Occupation: Environmental health practitioner  Tobacco Use   Smoking status: Never    Passive exposure: Past   Smokeless tobacco: Never  Vaping Use   Vaping Use: Never used  Substance and Sexual Activity   Alcohol use: Yes    Comment: rare mixed drink   Drug use: Never   Sexual activity: Not on file  Other Topics Concern   Not on file  Social History Narrative   Caffeine use: 1 cup coffee, 2 glasses of tea.   Right-handed.   Lives at home with her husband, Craige Cotta.   Social Determinants of Health   Financial Resource Strain: Not on file  Food Insecurity:  Not on file  Transportation Needs: Not on file  Physical Activity: Not on file  Stress: Not on file  Social Connections: Not on file  Intimate Partner Violence: Not on file    Subjective: Review of Systems  Constitutional:  Negative for chills and fever.  HENT:  Negative for congestion and hearing loss.   Eyes:  Negative for blurred vision and double vision.  Respiratory:  Negative for cough and shortness of breath.   Cardiovascular:  Negative for chest pain and palpitations.  Gastrointestinal:  Negative for abdominal pain, blood in stool, constipation, diarrhea, heartburn, melena and vomiting.  Genitourinary:  Negative for dysuria and urgency.  Musculoskeletal:  Negative for joint  pain and myalgias.  Skin:  Negative for itching and rash.  Neurological:  Negative for dizziness and headaches.  Psychiatric/Behavioral:  Negative for depression. The patient is not nervous/anxious.        Objective: BP 128/84 (BP Location: Left Arm, Patient Position: Sitting, Cuff Size: Large)   Pulse 91   Temp 98.5 F (36.9 C) (Oral)   Ht 5\' 3"  (1.6 m)   Wt 164 lb 9.6 oz (74.7 kg)   BMI 29.16 kg/m  Physical Exam Constitutional:      Appearance: Normal appearance.  HENT:     Head: Normocephalic and atraumatic.  Eyes:     Extraocular Movements: Extraocular movements intact.     Conjunctiva/sclera: Conjunctivae normal.  Cardiovascular:     Rate and Rhythm: Normal rate and regular rhythm.  Pulmonary:     Effort: Pulmonary effort is normal.     Breath sounds: Normal breath sounds.  Abdominal:     General: Bowel sounds are normal.     Palpations: Abdomen is soft.  Musculoskeletal:        General: No swelling. Normal range of motion.     Cervical back: Normal range of motion and neck supple.  Skin:    General: Skin is warm and dry.     Coloration: Skin is not jaundiced.  Neurological:     General: No focal deficit present.     Mental Status: She is alert and oriented to person, place, and time.  Psychiatric:        Mood and Affect: Mood normal.        Behavior: Behavior normal.      Assessment: *Chronic GERD-mild, intermittent *Hiatal hernia *Abnormal CT of esophagus, esophageal wall thickening *Colon cancer screening  Plan: Will schedule for EGD to evaluate for peptic ulcer disease, esophagitis, gastritis, H. Pylori, duodenitis, or other. Will also evaluate for esophageal stricture, Schatzki's ring, esophageal web or other.   The risks including infection, bleed, or perforation as well as benefits, limitations, alternatives and imponderables have been reviewed with the patient. Potential for esophageal dilation, biopsy, etc. have also been reviewed.  Questions  have been answered. All parties agreeable.  Patient requesting Cologuard testing for colon cancer screening purposes.  We will order today.  Further recommendations to follow.  Thank you Dr. for the kind referral 10/20/2022 10:32 AM   Disclaimer: This note was dictated with voice recognition software. Similar sounding words can inadvertently be transcribed and may not be corrected upon review.

## 2022-10-20 NOTE — Patient Instructions (Signed)
Will schedule you for upper endoscopy to further evaluate the thickening seen on your esophagus on recent CT scan.  I will order Cologuard testing today for colon cancer screening purposes.  Further recommendations to follow.  It was very nice meeting both you today.  Dr. Marletta Lor

## 2022-11-15 DIAGNOSIS — Z1211 Encounter for screening for malignant neoplasm of colon: Secondary | ICD-10-CM | POA: Diagnosis not present

## 2022-11-16 ENCOUNTER — Telehealth: Payer: Self-pay | Admitting: *Deleted

## 2022-11-16 NOTE — Telephone Encounter (Signed)
Pt called and left voicemail that she is cancelling her EGD on 11/23/22. She states that she is not having any symptoms or problems at this time and feels that the procedure isn't necessary at this time. FYI to Dr.Carver

## 2022-11-21 LAB — COLOGUARD: COLOGUARD: NEGATIVE

## 2022-11-23 ENCOUNTER — Encounter (HOSPITAL_COMMUNITY): Admission: RE | Payer: Self-pay | Source: Ambulatory Visit

## 2022-11-23 ENCOUNTER — Ambulatory Visit (HOSPITAL_COMMUNITY): Admission: RE | Admit: 2022-11-23 | Payer: No Typology Code available for payment source | Source: Ambulatory Visit

## 2022-11-23 SURGERY — ESOPHAGOGASTRODUODENOSCOPY (EGD) WITH PROPOFOL
Anesthesia: Monitor Anesthesia Care

## 2022-12-06 DIAGNOSIS — H5203 Hypermetropia, bilateral: Secondary | ICD-10-CM | POA: Diagnosis not present

## 2023-01-20 DIAGNOSIS — E782 Mixed hyperlipidemia: Secondary | ICD-10-CM | POA: Diagnosis not present

## 2023-01-20 DIAGNOSIS — R7303 Prediabetes: Secondary | ICD-10-CM | POA: Diagnosis not present

## 2023-01-25 DIAGNOSIS — E669 Obesity, unspecified: Secondary | ICD-10-CM | POA: Diagnosis not present

## 2023-01-25 DIAGNOSIS — E782 Mixed hyperlipidemia: Secondary | ICD-10-CM | POA: Diagnosis not present

## 2023-01-25 DIAGNOSIS — Z974 Presence of external hearing-aid: Secondary | ICD-10-CM | POA: Diagnosis not present

## 2023-01-25 DIAGNOSIS — Z713 Dietary counseling and surveillance: Secondary | ICD-10-CM | POA: Diagnosis not present

## 2023-01-25 DIAGNOSIS — J302 Other seasonal allergic rhinitis: Secondary | ICD-10-CM | POA: Diagnosis not present

## 2023-01-25 DIAGNOSIS — I10 Essential (primary) hypertension: Secondary | ICD-10-CM | POA: Diagnosis not present

## 2023-01-25 DIAGNOSIS — R7303 Prediabetes: Secondary | ICD-10-CM | POA: Diagnosis not present

## 2023-01-25 DIAGNOSIS — Z7182 Exercise counseling: Secondary | ICD-10-CM | POA: Diagnosis not present

## 2023-01-25 DIAGNOSIS — Z6830 Body mass index (BMI) 30.0-30.9, adult: Secondary | ICD-10-CM | POA: Diagnosis not present

## 2023-01-25 DIAGNOSIS — I700 Atherosclerosis of aorta: Secondary | ICD-10-CM | POA: Diagnosis not present

## 2023-01-25 DIAGNOSIS — H8103 Meniere's disease, bilateral: Secondary | ICD-10-CM | POA: Diagnosis not present

## 2023-01-25 DIAGNOSIS — K449 Diaphragmatic hernia without obstruction or gangrene: Secondary | ICD-10-CM | POA: Diagnosis not present

## 2023-07-19 DIAGNOSIS — R7303 Prediabetes: Secondary | ICD-10-CM | POA: Diagnosis not present

## 2023-07-19 DIAGNOSIS — E782 Mixed hyperlipidemia: Secondary | ICD-10-CM | POA: Diagnosis not present

## 2023-07-26 ENCOUNTER — Emergency Department (HOSPITAL_COMMUNITY)
Admission: EM | Admit: 2023-07-26 | Discharge: 2023-07-26 | Disposition: A | Discharge status: Home / Self Care | Payer: No Typology Code available for payment source | Attending: Emergency Medicine | Admitting: Emergency Medicine

## 2023-07-26 ENCOUNTER — Emergency Department (HOSPITAL_COMMUNITY): Payer: No Typology Code available for payment source

## 2023-07-26 ENCOUNTER — Other Ambulatory Visit: Payer: Self-pay

## 2023-07-26 ENCOUNTER — Encounter (HOSPITAL_COMMUNITY): Payer: Self-pay | Admitting: Emergency Medicine

## 2023-07-26 DIAGNOSIS — Z79899 Other long term (current) drug therapy: Secondary | ICD-10-CM | POA: Diagnosis not present

## 2023-07-26 DIAGNOSIS — I10 Essential (primary) hypertension: Secondary | ICD-10-CM | POA: Insufficient documentation

## 2023-07-26 DIAGNOSIS — U071 COVID-19: Secondary | ICD-10-CM | POA: Insufficient documentation

## 2023-07-26 DIAGNOSIS — I700 Atherosclerosis of aorta: Secondary | ICD-10-CM | POA: Diagnosis not present

## 2023-07-26 DIAGNOSIS — E876 Hypokalemia: Secondary | ICD-10-CM | POA: Insufficient documentation

## 2023-07-26 DIAGNOSIS — R0602 Shortness of breath: Secondary | ICD-10-CM | POA: Diagnosis not present

## 2023-07-26 DIAGNOSIS — I2510 Atherosclerotic heart disease of native coronary artery without angina pectoris: Secondary | ICD-10-CM | POA: Diagnosis not present

## 2023-07-26 LAB — I-STAT CHEM 8, ED
BUN: 13 mg/dL (ref 8–23)
Calcium, Ion: 1.21 mmol/L (ref 1.15–1.40)
Chloride: 101 mmol/L (ref 98–111)
Creatinine, Ser: 0.8 mg/dL (ref 0.44–1.00)
Glucose, Bld: 124 mg/dL — ABNORMAL HIGH (ref 70–99)
HCT: 39 % (ref 36.0–46.0)
Hemoglobin: 13.3 g/dL (ref 12.0–15.0)
Potassium: 2.9 mmol/L — ABNORMAL LOW (ref 3.5–5.1)
Sodium: 136 mmol/L (ref 135–145)
TCO2: 25 mmol/L (ref 22–32)

## 2023-07-26 LAB — CBC WITH DIFFERENTIAL/PLATELET
Abs Immature Granulocytes: 0.04 10*3/uL (ref 0.00–0.07)
Basophils Absolute: 0 10*3/uL (ref 0.0–0.1)
Basophils Relative: 0 %
Eosinophils Absolute: 0.1 10*3/uL (ref 0.0–0.5)
Eosinophils Relative: 1 %
HCT: 41.1 % (ref 36.0–46.0)
Hemoglobin: 14.2 g/dL (ref 12.0–15.0)
Immature Granulocytes: 1 %
Lymphocytes Relative: 13 %
Lymphs Abs: 1.1 10*3/uL (ref 0.7–4.0)
MCH: 31.1 pg (ref 26.0–34.0)
MCHC: 34.5 g/dL (ref 30.0–36.0)
MCV: 89.9 fL (ref 80.0–100.0)
Monocytes Absolute: 0.4 10*3/uL (ref 0.1–1.0)
Monocytes Relative: 5 %
Neutro Abs: 6.6 10*3/uL (ref 1.7–7.7)
Neutrophils Relative %: 80 %
Platelets: 209 10*3/uL (ref 150–400)
RBC: 4.57 MIL/uL (ref 3.87–5.11)
RDW: 11.3 % — ABNORMAL LOW (ref 11.5–15.5)
WBC: 8.2 10*3/uL (ref 4.0–10.5)
nRBC: 0 % (ref 0.0–0.2)

## 2023-07-26 LAB — RESP PANEL BY RT-PCR (RSV, FLU A&B, COVID)  RVPGX2
Influenza A by PCR: NEGATIVE
Influenza B by PCR: NEGATIVE
Resp Syncytial Virus by PCR: NEGATIVE
SARS Coronavirus 2 by RT PCR: POSITIVE — AB

## 2023-07-26 LAB — BASIC METABOLIC PANEL
Anion gap: 8 (ref 5–15)
BUN: 15 mg/dL (ref 8–23)
CO2: 25 mmol/L (ref 22–32)
Calcium: 9.1 mg/dL (ref 8.9–10.3)
Chloride: 101 mmol/L (ref 98–111)
Creatinine, Ser: 0.81 mg/dL (ref 0.44–1.00)
GFR, Estimated: >60 mL/min (ref >60)
Glucose, Bld: 127 mg/dL — ABNORMAL HIGH (ref 70–99)
Potassium: 2.9 mmol/L — ABNORMAL LOW (ref 3.5–5.1)
Sodium: 134 mmol/L — ABNORMAL LOW (ref 135–145)

## 2023-07-26 LAB — BRAIN NATRIURETIC PEPTIDE: B Natriuretic Peptide: 20 pg/mL (ref 0.0–100.0)

## 2023-07-26 LAB — TROPONIN I (HIGH SENSITIVITY)
Troponin I (High Sensitivity): 3 ng/L (ref <18)
Troponin I (High Sensitivity): 3 ng/L (ref <18)

## 2023-07-26 LAB — MAGNESIUM: Magnesium: 1.9 mg/dL (ref 1.7–2.4)

## 2023-07-26 MED ORDER — POTASSIUM CHLORIDE CRYS ER 20 MEQ PO TBCR: 20.0000 meq | EXTENDED_RELEASE_TABLET | Freq: Two times a day (BID) | ORAL | 0 refills | Status: DC

## 2023-07-26 MED ORDER — IOHEXOL 350 MG/ML SOLN
75.0000 mL | Freq: Once | INTRAVENOUS | Status: AC | PRN
  Administered 2023-07-26: 75 mL via INTRAVENOUS

## 2023-07-26 MED ORDER — ALBUTEROL SULFATE HFA 108 (90 BASE) MCG/ACT IN AERS: 2.0000 | INHALATION_SPRAY | RESPIRATORY_TRACT | Status: DC | PRN

## 2023-07-26 MED ORDER — POTASSIUM CHLORIDE CRYS ER 20 MEQ PO TBCR
40.0000 meq | EXTENDED_RELEASE_TABLET | Freq: Once | ORAL | Status: AC
  Administered 2023-07-26: 40 meq via ORAL
  Filled 2023-07-26: qty 2

## 2023-07-26 NOTE — Discharge Instructions (Signed)
You have COVID.  Take Tylenol as needed.  Stay hydrated.  Additionally, your potassium was low today.  You did receive a supplement in the emergency department but should take supplements for the next 2 days as well.  A prescription was sent to your pharmacy.  Follow-up with your primary care doctor for repeat lab work in the next 1 to 2 weeks.  Return to the emergency department for any new or worsening symptoms of concern.

## 2023-07-26 NOTE — ED Notes (Signed)
Patient transported to CT 

## 2023-07-26 NOTE — ED Provider Notes (Signed)
[facility] Provider Note   CSN: [id number] Arrival date & time: 07/26/23  [id number]     History  No chief complaint on file.   Yolanda Novak is a 67 y.o. female.  HPI Patient presents for shortness of breath.  Medical history includes HLD, HTN, Mnire's disease.  Last week, her husband tested positive for COVID.  6 days ago, she developed low-grade fevers, nonproductive cough, and fatigue.  Despite decreased appetite, she has been eating and drinking normally.  She has not had any vomiting or diarrhea.  Last night, she awoke with shortness of breath.  She describes this as feeling like it is difficult to take in a full inspiration.  Shortness of breath has since improved.  She denies any areas of pain.  She has no known chronic lung disease.  She has no smoking history.    Home Medications Prior to Admission medications   Medication Sig Start Date End Date Taking? Authorizing Provider  CALCIUM PO Take 1 tablet by mouth daily.   Yes [provider]  Multiple Vitamin (MULTIVITAMIN) capsule Take 1 capsule by mouth daily.   Yes [provider]  Olmesartan-amLODIPine-HCTZ 20-5-12.5 MG TABS Take 1 tablet by mouth daily. 05/16/18  Yes [provider]  potassium chloride SA (KLOR-CON M) 20 MEQ tablet Take 1 tablet (20 mEq total) by mouth 2 (two) times daily for 2 days. 07/26/23 07/28/23 Yes Gloris Manchester, MD  rosuvastatin (CRESTOR) 5 MG tablet Take 5 mg by mouth. One every other day   Yes [provider]      Allergies    Sulfamethoxazole    Review of Systems   Review of Systems  Constitutional:  Positive for activity change, appetite change, fatigue and fever.  Respiratory:  Positive for cough and shortness of breath.   All other systems reviewed and are negative.   Physical Exam Updated Vital Signs BP 118/70   Pulse 90   Temp 98.2 F (36.8 C) (Oral)   Resp 16   Ht 5\' 3"  (1.6 m)   Wt 72.6 kg   SpO2 95%    BMI 28.34 kg/m  Physical Exam Vitals and nursing note reviewed.  Constitutional:      General: She is not in acute distress.    Appearance: Normal appearance. She is well-developed. She is not ill-appearing, toxic-appearing or diaphoretic.  HENT:     Head: Normocephalic and atraumatic.     Right Ear: External ear normal.     Left Ear: External ear normal.     Nose: Nose normal.     Mouth/Throat:     Mouth: Mucous membranes are moist.  Eyes:     Extraocular Movements: Extraocular movements intact.     Conjunctiva/sclera: Conjunctivae normal.  Cardiovascular:     Rate and Rhythm: Normal rate and regular rhythm.     Heart sounds: No murmur heard. Pulmonary:     Effort: Pulmonary effort is normal. No respiratory distress.     Breath sounds: Normal breath sounds. No wheezing, rhonchi or rales.  Chest:     Chest wall: No tenderness.  Abdominal:     General: There is no distension.     Palpations: Abdomen is soft.     Tenderness: There is no abdominal tenderness.  Musculoskeletal:        General: No swelling. Normal range of motion.     Cervical back: Normal range of motion and neck supple.     Right lower  leg: No edema.     Left lower leg: No edema.  Skin:    General: Skin is warm and dry.     Coloration: Skin is not jaundiced or pale.  Neurological:     General: No focal deficit present.     Mental Status: She is alert and oriented to person, place, and time.  Psychiatric:        Mood and Affect: Mood normal.        Behavior: Behavior normal.     ED Results / Procedures / Treatments   Labs (all labs ordered are listed, but only abnormal results are displayed) Labs Reviewed  RESP PANEL BY RT-PCR (RSV, FLU A&B, COVID)  RVPGX2 - Abnormal; Notable for the following components:      Result Value   SARS Coronavirus 2 by RT PCR POSITIVE (*)    All other components within normal limits  BASIC METABOLIC PANEL - Abnormal; Notable for the following components:   Sodium 134  (*)    Potassium 2.9 (*)    Glucose, Bld 127 (*)    All other components within normal limits  CBC WITH DIFFERENTIAL/PLATELET - Abnormal; Notable for the following components:   RDW 11.3 (*)    All other components within normal limits  I-STAT CHEM 8, ED - Abnormal; Notable for the following components:   Potassium 2.9 (*)    Glucose, Bld 124 (*)    All other components within normal limits  BRAIN NATRIURETIC PEPTIDE  MAGNESIUM  TROPONIN I (HIGH SENSITIVITY)  TROPONIN I (HIGH SENSITIVITY)    EKG EKG Interpretation Date/Time:  Tuesday July 26 2023 06:57:14 EDT Ventricular Rate:  89 PR Interval:  173 QRS Duration:  91 QT Interval:  357 QTC Calculation: 435 R Axis:   63  Text Interpretation: Sinus rhythm Confirmed by Gloris Manchester ([id number]) on 07/26/2023 8:54:00 AM  Radiology CT Angio Chest PE W and/or Wo Contrast  Result Date: 07/26/2023 CLINICAL DATA:  Pulmonary embolism (PE) suspected, high prob. COVID positive. Shortness of breath. EXAM: CT ANGIOGRAPHY CHEST WITH CONTRAST TECHNIQUE: Multidetector CT imaging of the chest was performed using the standard protocol during bolus administration of intravenous contrast. Multiplanar CT image reconstructions and MIPs were obtained to evaluate the vascular anatomy. RADIATION DOSE REDUCTION: This exam was performed according to the departmental dose-optimization program which includes automated exposure control, adjustment of the mA and/or kV according to patient size and/or use of iterative reconstruction technique. CONTRAST:  75mL OMNIPAQUE IOHEXOL 350 MG/ML SOLN COMPARISON:  Cardiac CT 08/17/2022. FINDINGS: Cardiovascular: Satisfactory opacification of the pulmonary arteries to the segmental level. No evidence of pulmonary embolism. Normal heart size. No pericardial effusion. Unchanged atherosclerotic calcifications of the coronary arteries and aortic arch. Mediastinum/Nodes: No enlarged mediastinal, hilar, or axillary lymph nodes. Thyroid  gland, trachea, and esophagus demonstrate no significant findings. Lungs/Pleura: Lungs are clear. No pleural effusion or pneumothorax. Upper Abdomen: No acute abnormality. Musculoskeletal: No chest wall abnormality. No acute or significant osseous findings. Review of the MIP images confirms the above findings. IMPRESSION: No evidence of pulmonary embolism or other acute findings in the chest. Aortic Atherosclerosis (ICD10-I70.0). Electronically Signed   By: Orvan Falconer M.D.   On: 07/26/2023 11:35   DG Chest 2 View  Result Date: 07/26/2023 CLINICAL DATA:  Shortness of breath EXAM: CHEST - 2 VIEW COMPARISON:  12/06/2006 FINDINGS: Normal heart size and mediastinal contours. No acute infiltrate or edema. No effusion or pneumothorax. No acute osseous findings. IMPRESSION: Negative chest. Electronically Signed  By: Tiburcio Pea M.D.   On: 07/26/2023 08:28    Procedures Procedures    Medications Ordered in ED Medications  albuterol (VENTOLIN HFA) 108 (90 Base) MCG/ACT inhaler 2 puff (has no administration in time range)  potassium chloride SA (KLOR-CON M) CR tablet 40 mEq (40 mEq Oral Given 07/26/23 0857)  iohexol (OMNIPAQUE) 350 MG/ML injection 75 mL (75 mLs Intravenous Contrast Given 07/26/23 1610)    ED Course/ Medical Decision Making/ A&P                                 Medical Decision Making Amount and/or Complexity of Data Reviewed Labs: ordered. Radiology: ordered.  Risk Prescription drug management.   This patient presents to the ED for concern of shortness of breath, this involves an extensive number of treatment options, and is a complaint that carries with it a high risk of complications and morbidity.  The differential diagnosis includes URI, pneumonia, PE, anxiety, ACS   Co morbidities that complicate the patient evaluation  HLD, HTN, Mnire's disease   Additional history obtained:  Additional history obtained from N/A External records from outside source  obtained and reviewed including EMR   Lab Tests:  I Ordered, and personally interpreted labs.  The pertinent results include: Hypokalemia is present with otherwise normal electrolytes.  Kidney function is normal.  No leukocytosis is present.  There is no evidence of anemia.  Opponent and BNP are normal.  Patient did test positive for COVID-19.   Imaging Studies ordered:  I ordered imaging studies including chest x-ray, CTA chest I independently visualized and interpreted imaging which showed no acute findings I agree with the radiologist interpretation   Cardiac Monitoring: / EKG:  The patient was maintained on a cardiac monitor.  I personally viewed and interpreted the cardiac monitored which showed an underlying rhythm of: Sinus rhythm  Problem List / ED Course / Critical interventions / Medication management  Patient presents for shortness of breath.  Breathing felt normal to her yesterday.  She woke in middle the night with difficulty breathing.  This has since improved.  She denies any associated chest pain.  This is in the setting of recent COVID exposure and some mild URI symptoms over the past 6 days.  On arrival in the ED, she is well-appearing.  Her breathing is currently unlabored.  Lungs are clear to auscultation.  Workup was initiated.  Patient remained asymptomatic while in the ED.  Workup was notable for hypokalemia.  She did receive replacement potassium and was prescribed additional potassium replacement for the next 2 days.  Given otherwise reassuring workup and minimal symptoms, she is stable for discharge. I ordered medication including potassium chloride for hypokalemia Reevaluation of the patient after these medicines showed that the patient improved I have reviewed the patients home medicines and have made adjustments as needed   Social Determinants of Health:  Has PCP         Final Clinical Impression(s) / ED Diagnoses Final diagnoses:  COVID-19   Hypokalemia    Rx / DC Orders ED Discharge Orders          Ordered    potassium chloride SA (KLOR-CON M) 20 MEQ tablet  2 times daily        07/26/23 1241              Gloris Manchester, MD 07/26/23 1242

## 2023-07-26 NOTE — ED Triage Notes (Signed)
Pt to ed with c/o of sob. Pt woke up feeling sob. Pt was exposed to covid by spouse last week who was positive. Pt developed symptoms and assumed pt had covid but never tested.

## 2023-07-26 NOTE — ED Notes (Signed)
Pt tested positive for covid, airborne precautions sign placed on door

## 2023-08-12 DIAGNOSIS — R7303 Prediabetes: Secondary | ICD-10-CM | POA: Diagnosis not present

## 2023-08-12 DIAGNOSIS — I10 Essential (primary) hypertension: Secondary | ICD-10-CM | POA: Diagnosis not present

## 2023-08-12 DIAGNOSIS — H8103 Meniere's disease, bilateral: Secondary | ICD-10-CM | POA: Diagnosis not present

## 2023-08-12 DIAGNOSIS — E6609 Other obesity due to excess calories: Secondary | ICD-10-CM | POA: Diagnosis not present

## 2023-08-12 DIAGNOSIS — E876 Hypokalemia: Secondary | ICD-10-CM | POA: Diagnosis not present

## 2023-08-12 DIAGNOSIS — Z6830 Body mass index (BMI) 30.0-30.9, adult: Secondary | ICD-10-CM | POA: Diagnosis not present

## 2023-08-12 DIAGNOSIS — K449 Diaphragmatic hernia without obstruction or gangrene: Secondary | ICD-10-CM | POA: Diagnosis not present

## 2023-08-12 DIAGNOSIS — I700 Atherosclerosis of aorta: Secondary | ICD-10-CM | POA: Diagnosis not present

## 2023-08-12 DIAGNOSIS — J302 Other seasonal allergic rhinitis: Secondary | ICD-10-CM | POA: Diagnosis not present

## 2023-08-12 DIAGNOSIS — E782 Mixed hyperlipidemia: Secondary | ICD-10-CM | POA: Diagnosis not present

## 2023-10-19 ENCOUNTER — Encounter: Payer: Self-pay | Admitting: Podiatry

## 2023-10-19 ENCOUNTER — Ambulatory Visit: Payer: No Typology Code available for payment source | Admitting: Podiatry

## 2023-10-19 DIAGNOSIS — L600 Ingrowing nail: Secondary | ICD-10-CM

## 2023-10-19 DIAGNOSIS — M7752 Other enthesopathy of left foot: Secondary | ICD-10-CM | POA: Diagnosis not present

## 2023-10-19 NOTE — Patient Instructions (Signed)

## 2023-10-23 DIAGNOSIS — M7752 Other enthesopathy of left foot: Secondary | ICD-10-CM | POA: Diagnosis not present

## 2023-10-23 MED ORDER — TRIAMCINOLONE ACETONIDE 10 MG/ML IJ SUSP
10.0000 mg | Freq: Once | INTRAMUSCULAR | Status: AC
  Administered 2023-10-23: 10 mg via INTRA_ARTICULAR

## 2023-10-23 NOTE — Progress Notes (Signed)
Subjective:   Patient ID: Yolanda Novak, female   DOB: 67 y.o.   MRN: [id number]   HPI Patient presents stating that she has developed a regrowth of the lesion on the left big toe nail wise and it is bothersome with the vast majority the nail doing well but 1 spot that is irritated between the big toe and second toe and has inflammation underneath the foot that becomes bothersome for her   ROS      Objective:  Physical Exam  Neurovascular status intact with patient found to have regrowth the nailbed just on the medial portion of the nail with the center lateral having done beautifully from the previous surgery and inflammation lesser MPJ with structural deformity of the toes which probably is a part of why she develops what she develops     Assessment:  Ingrown toenail deformity left hallux medial border along with inflammatory capsulitis     Plan:  H&P reviewed and went ahead today and I did recommend correction of the nail border explained procedure risk and patient wants procedure signed consent form.  I infiltrated 60 mg like Marcaine mixture sterile prep done using sterile instrumentation removed the offending border exposed matrix applied phenol 3 applications 30 seconds followed by alcohol lavage sterile dressing gave instructions on soaks and to wear dressing 24 hours.  I then went ahead and where I found acute inflammation I did careful sterile prep and injected periarticular around the joint surface 3 mg dexamethasone Kenalog 5 mg Xylocaine

## 2024-02-06 DIAGNOSIS — E782 Mixed hyperlipidemia: Secondary | ICD-10-CM | POA: Diagnosis not present

## 2024-02-06 DIAGNOSIS — R7303 Prediabetes: Secondary | ICD-10-CM | POA: Diagnosis not present

## 2024-02-17 ENCOUNTER — Other Ambulatory Visit (HOSPITAL_COMMUNITY): Payer: Self-pay | Admitting: Internal Medicine

## 2024-02-17 DIAGNOSIS — I10 Essential (primary) hypertension: Secondary | ICD-10-CM | POA: Diagnosis not present

## 2024-02-17 DIAGNOSIS — Z13820 Encounter for screening for osteoporosis: Secondary | ICD-10-CM

## 2024-02-17 DIAGNOSIS — E782 Mixed hyperlipidemia: Secondary | ICD-10-CM | POA: Diagnosis not present

## 2024-02-17 DIAGNOSIS — I700 Atherosclerosis of aorta: Secondary | ICD-10-CM | POA: Diagnosis not present

## 2024-02-17 DIAGNOSIS — Z7182 Exercise counseling: Secondary | ICD-10-CM | POA: Diagnosis not present

## 2024-02-17 DIAGNOSIS — Z713 Dietary counseling and surveillance: Secondary | ICD-10-CM | POA: Diagnosis not present

## 2024-02-17 DIAGNOSIS — Z1231 Encounter for screening mammogram for malignant neoplasm of breast: Secondary | ICD-10-CM

## 2024-02-17 DIAGNOSIS — E669 Obesity, unspecified: Secondary | ICD-10-CM | POA: Diagnosis not present

## 2024-02-17 DIAGNOSIS — K449 Diaphragmatic hernia without obstruction or gangrene: Secondary | ICD-10-CM | POA: Diagnosis not present

## 2024-02-17 DIAGNOSIS — R7303 Prediabetes: Secondary | ICD-10-CM | POA: Diagnosis not present

## 2024-02-17 DIAGNOSIS — E876 Hypokalemia: Secondary | ICD-10-CM | POA: Diagnosis not present

## 2024-02-17 DIAGNOSIS — J302 Other seasonal allergic rhinitis: Secondary | ICD-10-CM | POA: Diagnosis not present

## 2024-02-17 DIAGNOSIS — H8103 Meniere's disease, bilateral: Secondary | ICD-10-CM | POA: Diagnosis not present

## 2024-02-17 DIAGNOSIS — Z974 Presence of external hearing-aid: Secondary | ICD-10-CM | POA: Diagnosis not present

## 2024-03-14 ENCOUNTER — Ambulatory Visit (HOSPITAL_COMMUNITY)
Admission: RE | Admit: 2024-03-14 | Discharge: 2024-03-14 | Disposition: A | Discharge status: Home / Self Care | Source: Ambulatory Visit | Attending: Internal Medicine | Admitting: Internal Medicine

## 2024-03-14 DIAGNOSIS — Z13820 Encounter for screening for osteoporosis: Secondary | ICD-10-CM | POA: Diagnosis not present

## 2024-03-14 DIAGNOSIS — Z780 Asymptomatic menopausal state: Secondary | ICD-10-CM | POA: Insufficient documentation

## 2024-03-14 DIAGNOSIS — Z1231 Encounter for screening mammogram for malignant neoplasm of breast: Secondary | ICD-10-CM | POA: Diagnosis not present

## 2024-05-31 ENCOUNTER — Ambulatory Visit (INDEPENDENT_AMBULATORY_CARE_PROVIDER_SITE_OTHER)

## 2024-05-31 ENCOUNTER — Encounter: Payer: Self-pay | Admitting: Podiatry

## 2024-05-31 ENCOUNTER — Ambulatory Visit (INDEPENDENT_AMBULATORY_CARE_PROVIDER_SITE_OTHER): Admitting: Podiatry

## 2024-05-31 DIAGNOSIS — M76822 Posterior tibial tendinitis, left leg: Secondary | ICD-10-CM | POA: Diagnosis not present

## 2024-05-31 DIAGNOSIS — M778 Other enthesopathies, not elsewhere classified: Secondary | ICD-10-CM | POA: Diagnosis not present

## 2024-05-31 DIAGNOSIS — M7752 Other enthesopathy of left foot: Secondary | ICD-10-CM | POA: Diagnosis not present

## 2024-05-31 DIAGNOSIS — M21619 Bunion of unspecified foot: Secondary | ICD-10-CM | POA: Diagnosis not present

## 2024-05-31 MED ORDER — TRIAMCINOLONE ACETONIDE 10 MG/ML IJ SUSP
10.0000 mg | Freq: Once | INTRAMUSCULAR | Status: AC
  Administered 2024-05-31: 10 mg via INTRA_ARTICULAR

## 2024-06-01 NOTE — Progress Notes (Signed)
 Subjective:   Patient ID: Yolanda Novak, female   DOB: 68 y.o.   MRN: [id number]   HPI Patient presents stating she has developed a lot of pain on the inside of the left ankle and it has been about a year since this is occurred.  Also has significant bunion deformity left with digital deformity second toe elevated   ROS      Objective:  Physical Exam  Neurovascular status intact with inflammation of the posterior tibial insertion left moderate flatfoot deformity painful when pressed.  Large hyperostosis medial aspect first metatarsal head with elevation of the second toe rigid     Assessment:  Posterior tibial tendinitis left with inflammation.  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  Advised on supportive therapy with bracing and I advised on supportive shoes.  Discussed the bunion deformity hammertoe deformity and this may be corrected but it would be a very difficult surgery and would require fusion.  Made her aware of this and we are not going to pursue that at this time  X-rays indicated that there is significant collapse medial longitudinal arch and there is severe structural malalignment with bunion deformity hammertoe deformity

## 2024-06-25 ENCOUNTER — Ambulatory Visit: Admitting: Podiatry

## 2024-08-07 DIAGNOSIS — H5203 Hypermetropia, bilateral: Secondary | ICD-10-CM | POA: Diagnosis not present

## 2024-08-15 DIAGNOSIS — E782 Mixed hyperlipidemia: Secondary | ICD-10-CM | POA: Diagnosis not present

## 2024-08-15 DIAGNOSIS — R7303 Prediabetes: Secondary | ICD-10-CM | POA: Diagnosis not present

## 2024-08-20 DIAGNOSIS — Z0000 Encounter for general adult medical examination without abnormal findings: Secondary | ICD-10-CM | POA: Diagnosis not present

## 2024-08-20 DIAGNOSIS — I10 Essential (primary) hypertension: Secondary | ICD-10-CM | POA: Diagnosis not present

## 2024-08-20 DIAGNOSIS — K449 Diaphragmatic hernia without obstruction or gangrene: Secondary | ICD-10-CM | POA: Diagnosis not present

## 2024-08-20 DIAGNOSIS — R7303 Prediabetes: Secondary | ICD-10-CM | POA: Diagnosis not present

## 2024-08-20 DIAGNOSIS — Z0001 Encounter for general adult medical examination with abnormal findings: Secondary | ICD-10-CM | POA: Diagnosis not present

## 2024-08-20 DIAGNOSIS — I700 Atherosclerosis of aorta: Secondary | ICD-10-CM | POA: Diagnosis not present

## 2024-08-20 DIAGNOSIS — M778 Other enthesopathies, not elsewhere classified: Secondary | ICD-10-CM | POA: Diagnosis not present

## 2024-08-20 DIAGNOSIS — J302 Other seasonal allergic rhinitis: Secondary | ICD-10-CM | POA: Diagnosis not present

## 2024-08-20 DIAGNOSIS — Z974 Presence of external hearing-aid: Secondary | ICD-10-CM | POA: Diagnosis not present

## 2024-08-20 DIAGNOSIS — E782 Mixed hyperlipidemia: Secondary | ICD-10-CM | POA: Diagnosis not present

## 2024-08-20 DIAGNOSIS — E6609 Other obesity due to excess calories: Secondary | ICD-10-CM | POA: Diagnosis not present

## 2024-08-20 DIAGNOSIS — H8103 Meniere's disease, bilateral: Secondary | ICD-10-CM | POA: Diagnosis not present
